# Patient Record
Sex: Male | Born: 1980 | Race: White | Hispanic: No | Marital: Married | Smoking: Never smoker
Health system: Southern US, Community
[De-identification: ages and names within clinical notes are randomized; demographics above are authoritative.]

## PROBLEM LIST (undated history)

## (undated) DIAGNOSIS — J45909 Unspecified asthma, uncomplicated: Secondary | ICD-10-CM

## (undated) DIAGNOSIS — B958 Unspecified staphylococcus as the cause of diseases classified elsewhere: Secondary | ICD-10-CM

## (undated) HISTORY — DX: Unspecified staphylococcus as the cause of diseases classified elsewhere: B95.8

## (undated) HISTORY — DX: Unspecified asthma, uncomplicated: J45.909

## (undated) HISTORY — PX: INCISION AND DRAINAGE ABSCESS: SHX5864

---

## 2010-10-07 ENCOUNTER — Other Ambulatory Visit (HOSPITAL_BASED_OUTPATIENT_CLINIC_OR_DEPARTMENT_OTHER): Payer: Self-pay | Admitting: Family Medicine

## 2010-10-07 DIAGNOSIS — R0600 Dyspnea, unspecified: Secondary | ICD-10-CM

## 2010-10-10 ENCOUNTER — Other Ambulatory Visit (HOSPITAL_BASED_OUTPATIENT_CLINIC_OR_DEPARTMENT_OTHER): Payer: Self-pay | Admitting: Family Medicine

## 2010-10-10 ENCOUNTER — Ambulatory Visit (HOSPITAL_BASED_OUTPATIENT_CLINIC_OR_DEPARTMENT_OTHER)
Admission: RE | Admit: 2010-10-10 | Discharge: 2010-10-10 | Disposition: A | Discharge status: Home / Self Care | Payer: BC Managed Care – PPO | Source: Ambulatory Visit | Attending: Family Medicine | Admitting: Family Medicine

## 2010-10-10 DIAGNOSIS — R05 Cough: Secondary | ICD-10-CM | POA: Insufficient documentation

## 2010-10-10 DIAGNOSIS — R059 Cough, unspecified: Secondary | ICD-10-CM | POA: Insufficient documentation

## 2010-10-10 DIAGNOSIS — R0609 Other forms of dyspnea: Secondary | ICD-10-CM | POA: Insufficient documentation

## 2010-10-10 DIAGNOSIS — R0600 Dyspnea, unspecified: Secondary | ICD-10-CM

## 2010-10-10 DIAGNOSIS — R0989 Other specified symptoms and signs involving the circulatory and respiratory systems: Secondary | ICD-10-CM | POA: Insufficient documentation

## 2010-10-10 DIAGNOSIS — R062 Wheezing: Secondary | ICD-10-CM | POA: Insufficient documentation

## 2010-10-10 MED ORDER — IOHEXOL 300 MG/ML  SOLN
80.0000 mL | Freq: Once | INTRAMUSCULAR | Status: AC | PRN
  Administered 2010-10-10: 80 mL via INTRAVENOUS

## 2011-12-12 IMAGING — CT CT CHEST W/ CM
2 of 3 series · 15 of 36 positions shown, 18 images · IV contrast (APPLIED)
Comparison: None.

CLINICAL DATA: No abnormal chest radiograph, coughing and wheezing

CT CHEST WITH CONTRAST
TECHNIQUE: Multidetector CT imaging of the chest was performed
following the standard protocol during bolus administration of
intravenous contrast.
Contrast: 80 ml Omnipaque 300

[Series 2: chest 5.0 b31f · axial · 0.75mm/px · z∈[-272,+8]mm · 12 of 66 slices shown, 15 images]
[im 5/66  mediastinal]
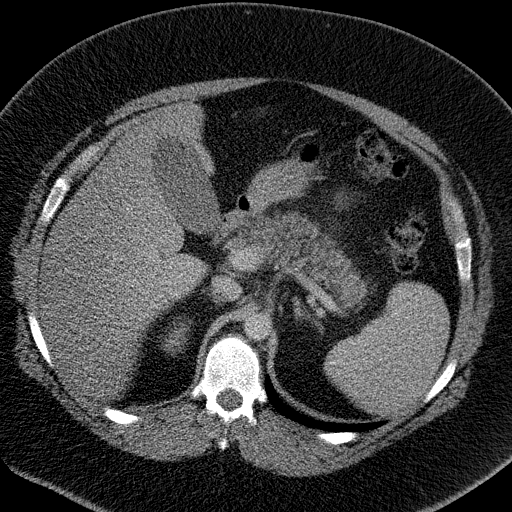
[im 5/66  lung]
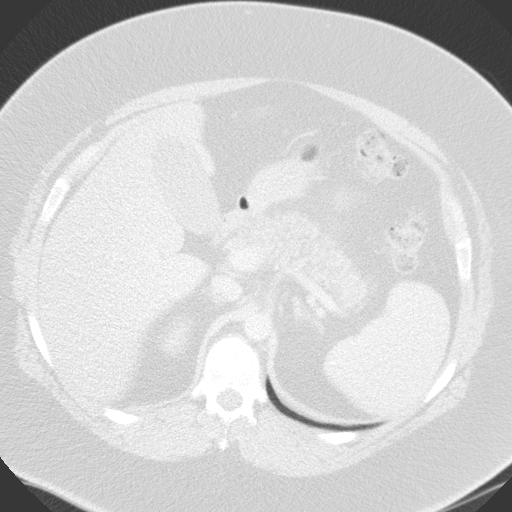
[im 10/66  lung]
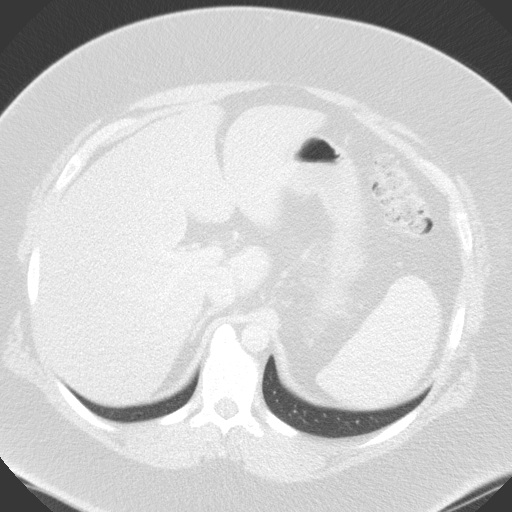
[im 15/66  lung]
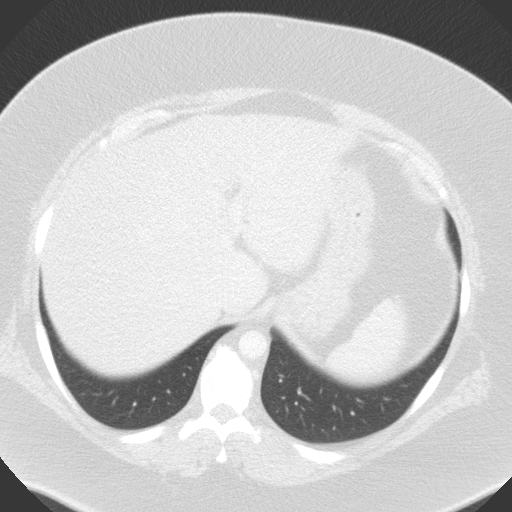
[im 20/66  lung]
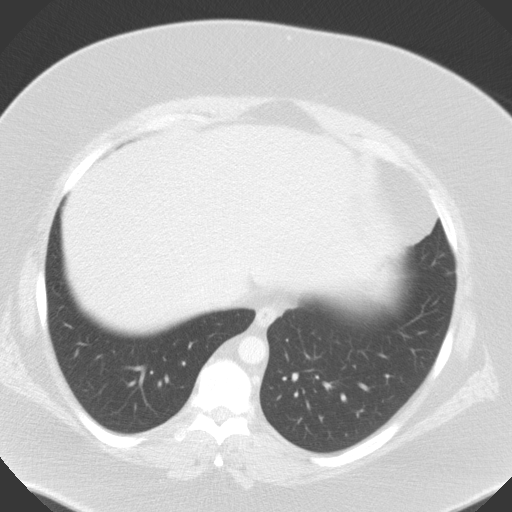
[im 25/66  mediastinal]
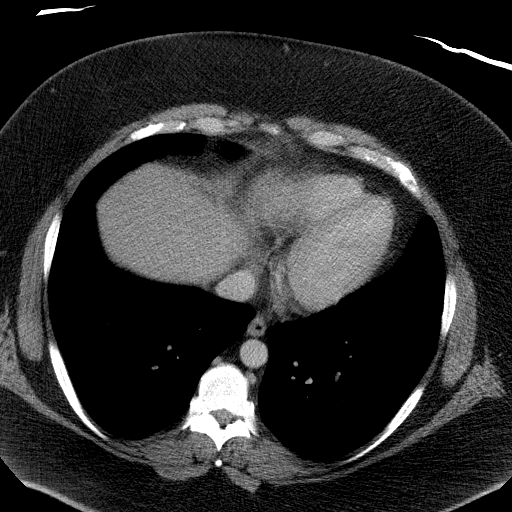
[im 25/66  lung]
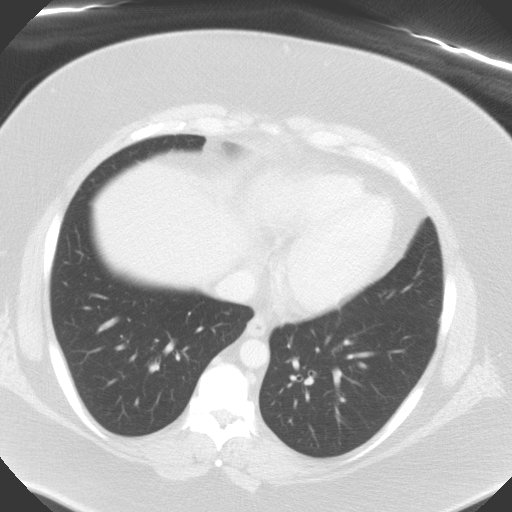
[im 29/66  lung]
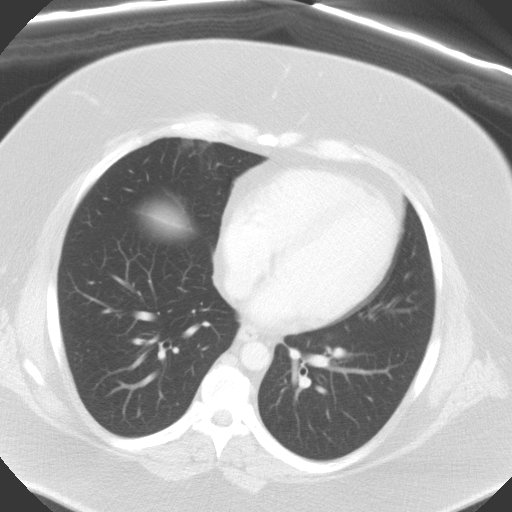
[im 37/66  lung]
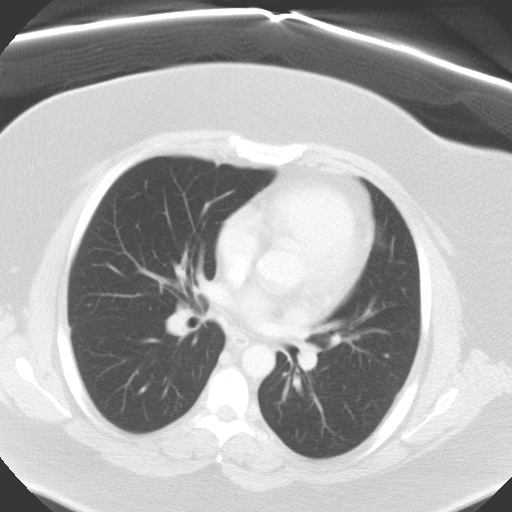
[im 41/66  lung]
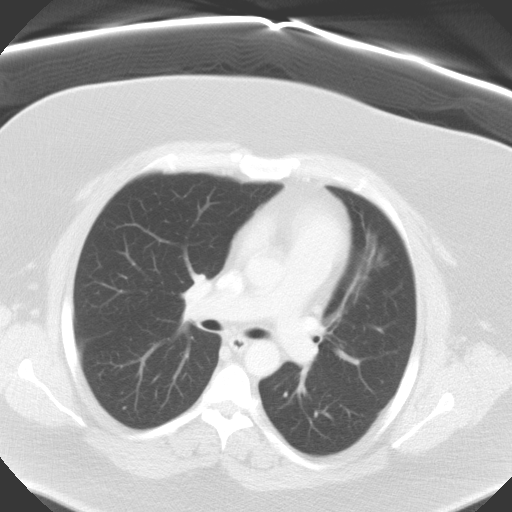
[im 46/66  mediastinal]
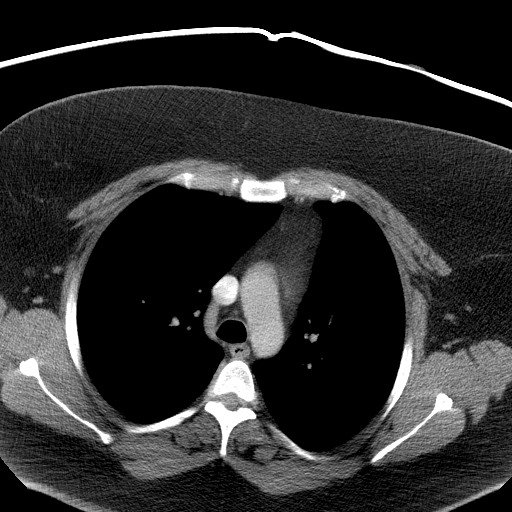
[im 46/66  lung]
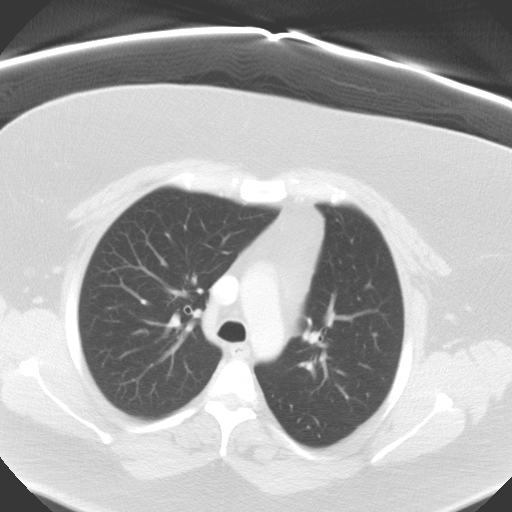
[im 51/66  lung]
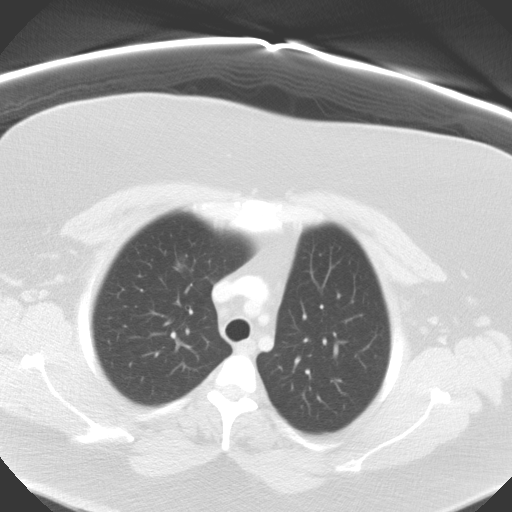
[im 56/66  lung]
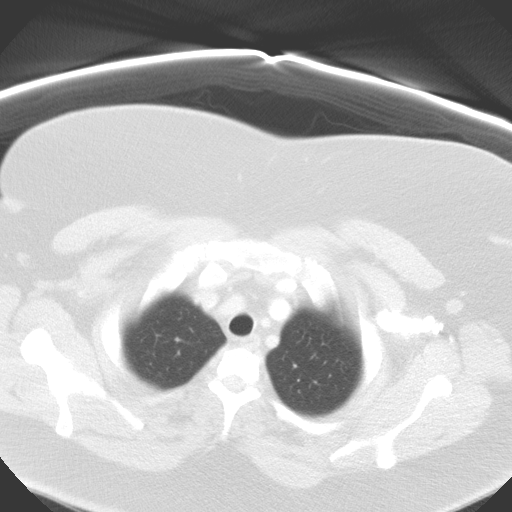
[im 61/66  lung]
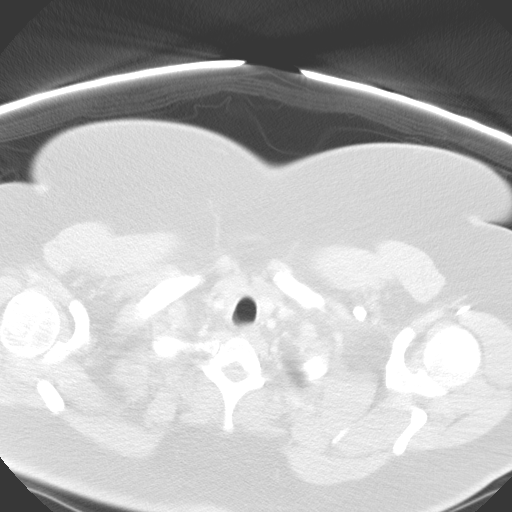

[Series 6: chest 3.0 coronal · coronal · 0.65mm/px · 3 of 113 slices shown]
[im 23/113  lung]
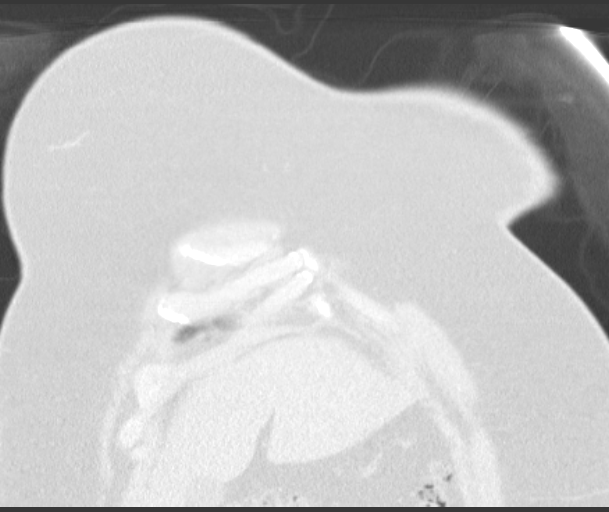
[im 45/113  lung]
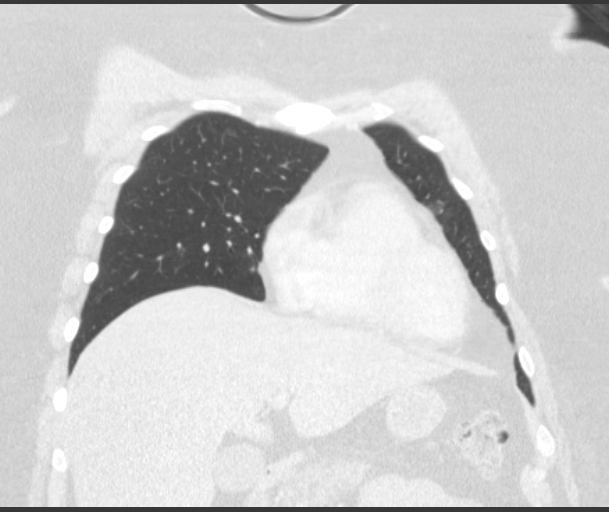
[im 68/113  lung]
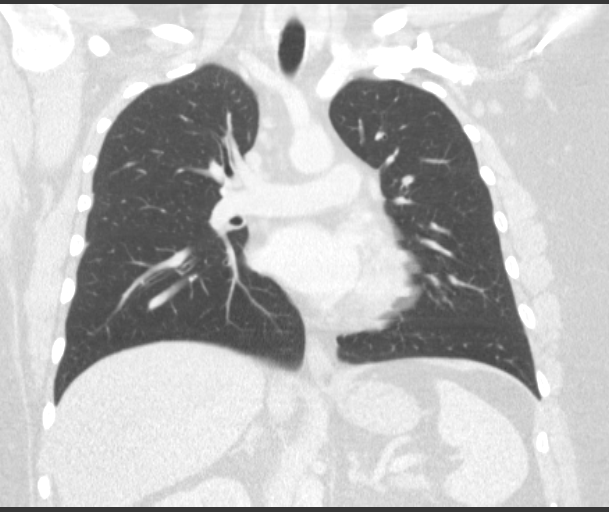

[15 of 36 positions shown; findings below may reference images not displayed]

FINDINGS: No evidence of axillary or supraclavicular
lymphadenopathy.  No mediastinal or hilar lymphadenopathy.  No
pericardial fluid. Esophagus is normal.

Review of the lung windows demonstrates a small focus of ground-
glass opacity within the lingula.  No evidence of pleural fluid,
consolidation, or pneumothorax.  Airways are normal.

Limited view of the upper abdomen is unremarkable.

Limited view of the skeleton demonstrates mild degenerative
spurring of the spine.
IMPRESSION: Small focus of ground-glass opacity in the lingula consistent with
infectious or inflammatory process.  This may represent early or
resolving infection.

## 2013-07-28 ENCOUNTER — Other Ambulatory Visit (HOSPITAL_BASED_OUTPATIENT_CLINIC_OR_DEPARTMENT_OTHER): Payer: Self-pay | Admitting: Family Medicine

## 2013-07-28 DIAGNOSIS — M7989 Other specified soft tissue disorders: Secondary | ICD-10-CM

## 2013-07-29 ENCOUNTER — Ambulatory Visit (HOSPITAL_BASED_OUTPATIENT_CLINIC_OR_DEPARTMENT_OTHER): Payer: BC Managed Care – PPO

## 2013-07-29 ENCOUNTER — Ambulatory Visit (HOSPITAL_BASED_OUTPATIENT_CLINIC_OR_DEPARTMENT_OTHER)
Admission: RE | Admit: 2013-07-29 | Discharge: 2013-07-29 | Disposition: A | Discharge status: Home / Self Care | Payer: BC Managed Care – PPO | Source: Ambulatory Visit | Attending: Family Medicine | Admitting: Family Medicine

## 2013-07-29 DIAGNOSIS — M7989 Other specified soft tissue disorders: Secondary | ICD-10-CM

## 2014-09-30 IMAGING — US US EXTREM LOW VENOUS*L*
1 series · 14 of 20 positions shown · non-contrast
Comparison: None.

CLINICAL DATA: Left leg swelling

EXAM:
LEFT LOWER EXTREMITY VENOUS DOPPLER ULTRASOUND
TECHNIQUE: Gray-scale sonography with graded compression, as well as color
Doppler and duplex ultrasound, were performed to evaluate the deep
venous system from the level of the common femoral vein through the
popliteal and proximal calf veins. Spectral Doppler was utilized to
evaluate flow at rest and with distal augmentation maneuvers.

[Series 1: us extrem low venous*left* · 14 of 20 slices shown]
[im 1/20]
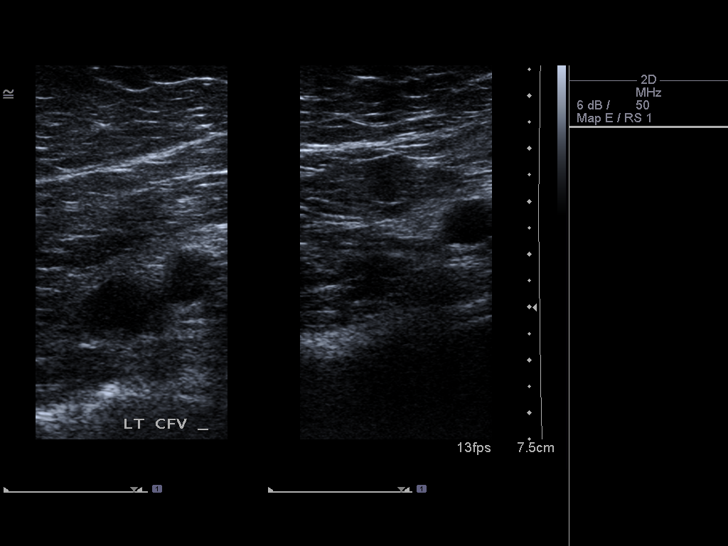
[im 3/20]
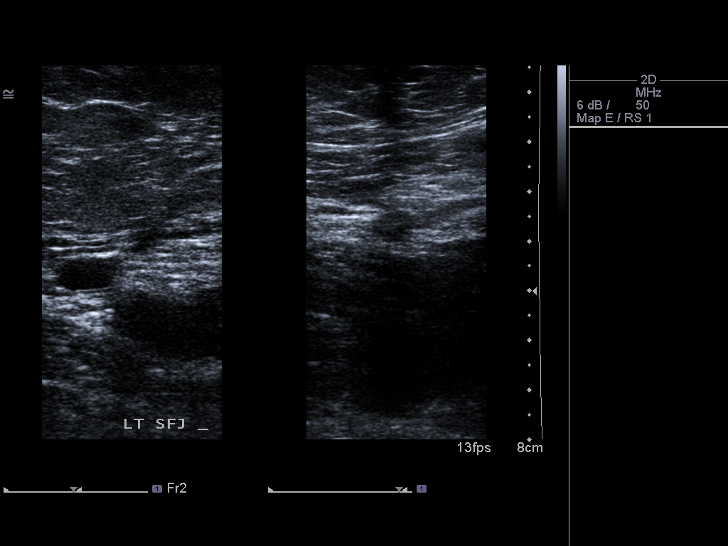
[im 4/20]
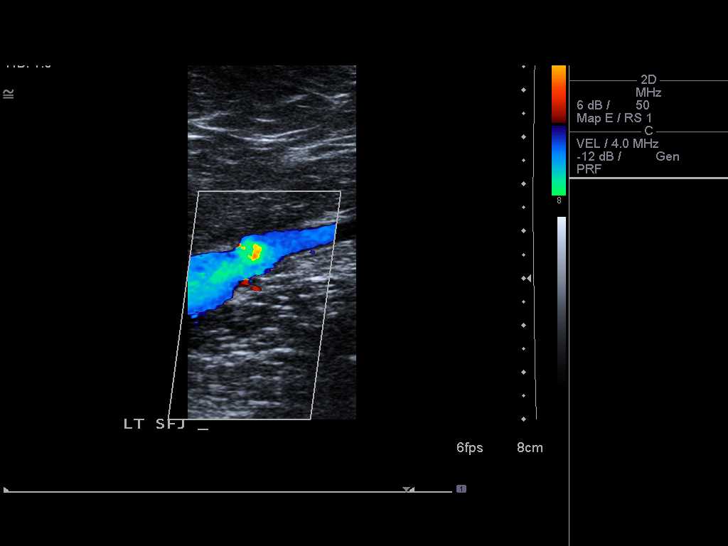
[im 6/20]
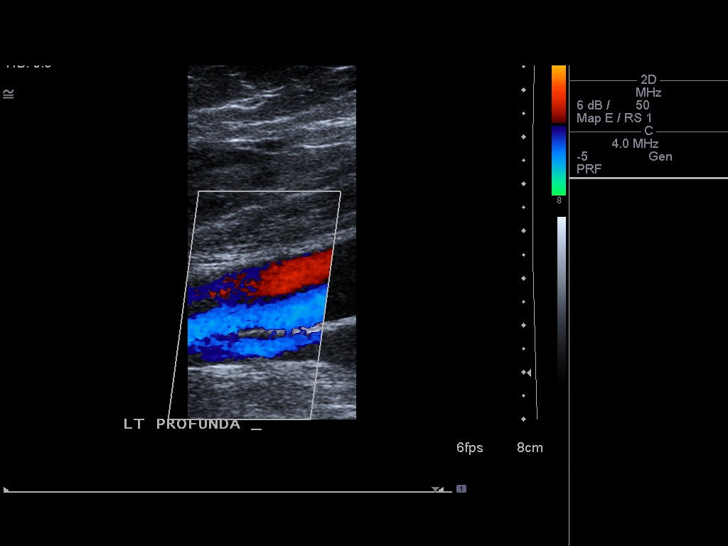
[im 7/20]
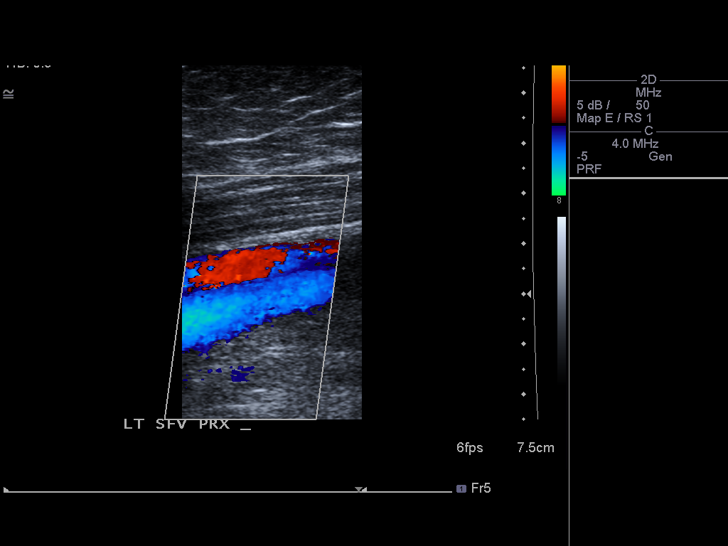
[im 8/20]
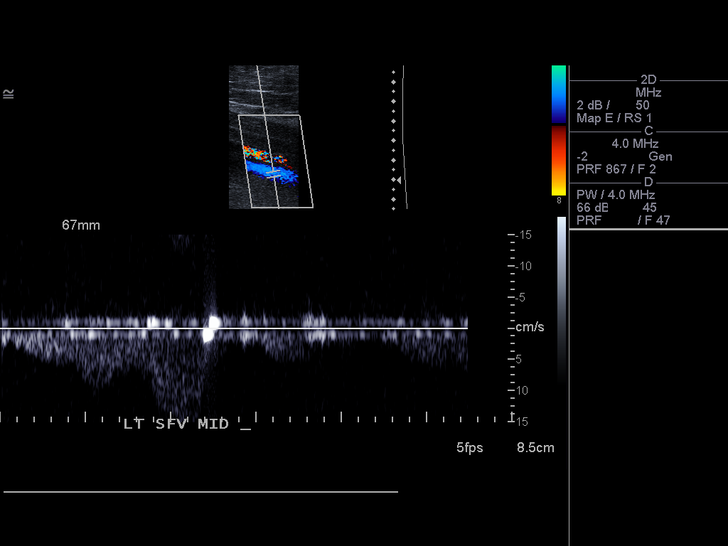
[im 10/20]
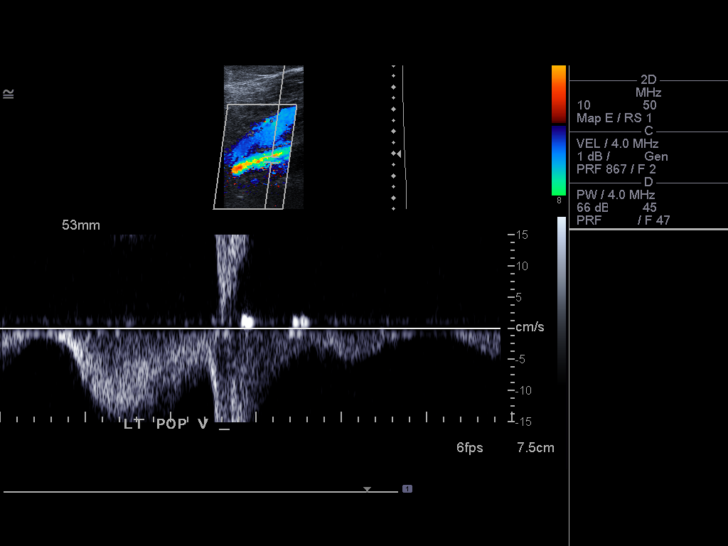
[im 11/20]
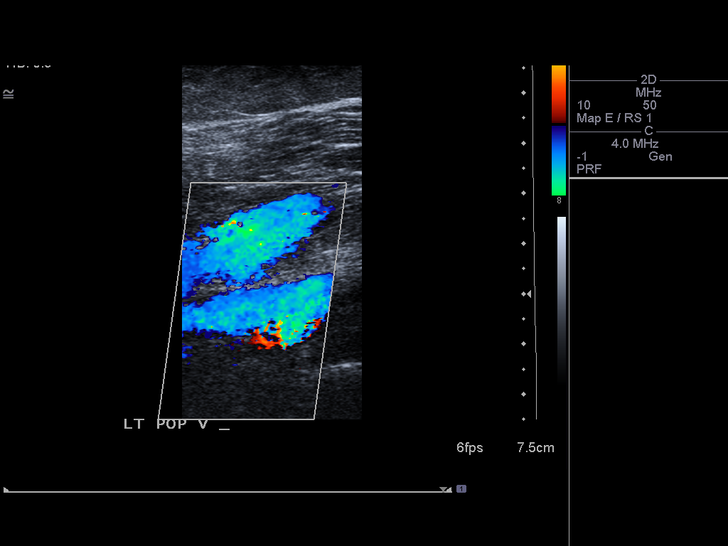
[im 13/20]
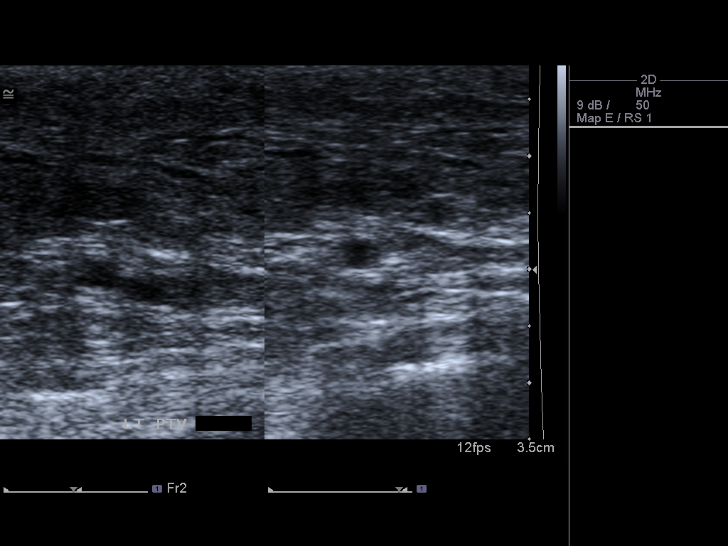
[im 14/20]
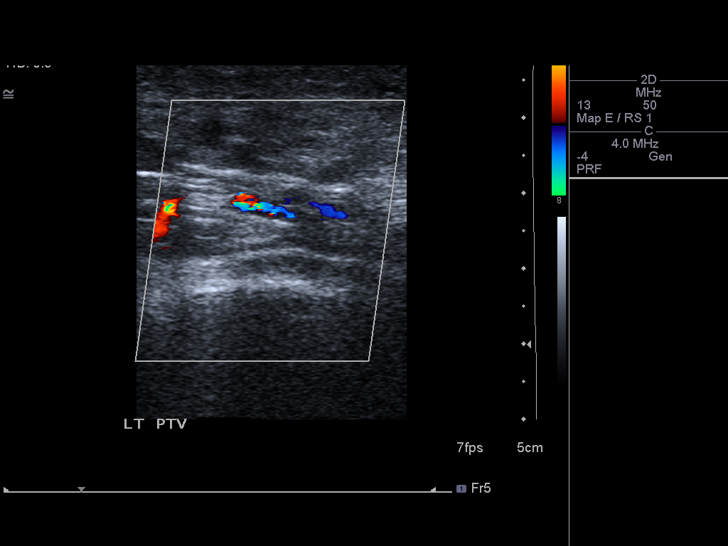
[im 16/20]
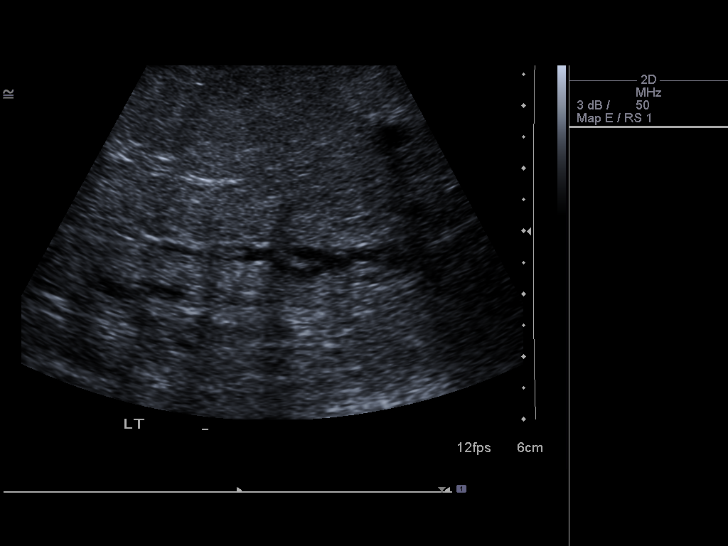
[im 17/20]
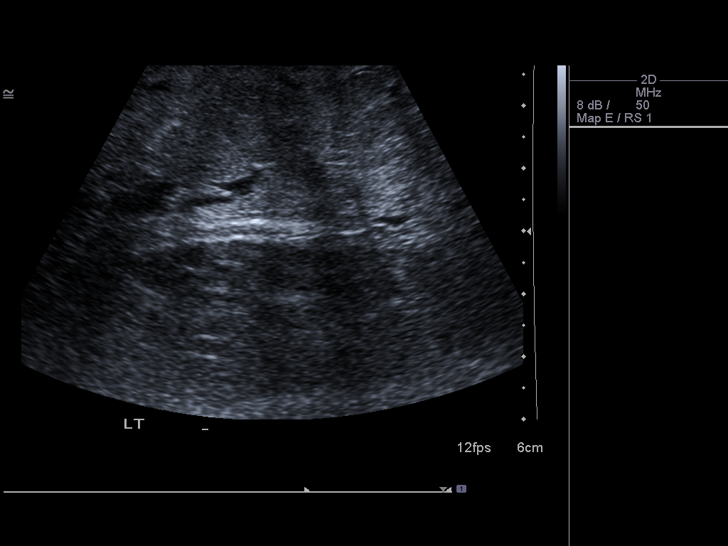
[im 18/20]
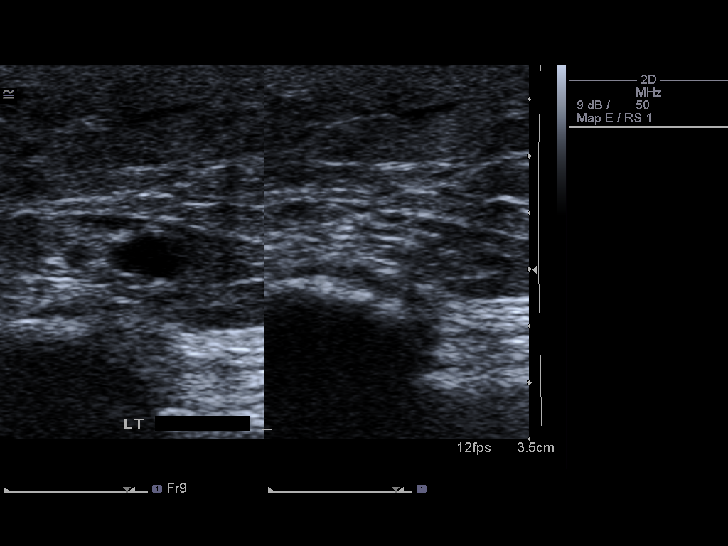
[im 20/20]
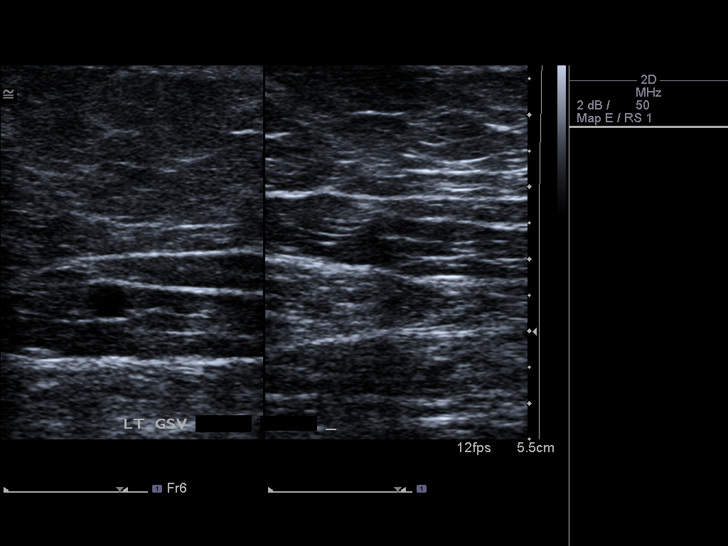

[14 of 20 positions shown; findings below may reference images not displayed]

FINDINGS: Thrombus within deep veins:  None visualized.

Compressibility of deep veins:  Normal.

Duplex waveform respiratory phasicity:  Normal.

Duplex waveform response to augmentation:  Normal.

Venous reflux:  None visualized.

Other findings:  None visualized.
IMPRESSION: No evidence of left lower extremity deep venous thrombosis.

## 2017-09-06 ENCOUNTER — Encounter: Payer: Self-pay | Admitting: Internal Medicine

## 2017-09-06 ENCOUNTER — Other Ambulatory Visit (INDEPENDENT_AMBULATORY_CARE_PROVIDER_SITE_OTHER): Payer: 59

## 2017-09-06 ENCOUNTER — Ambulatory Visit: Payer: 59 | Admitting: Internal Medicine

## 2017-09-06 VITALS — BP 138/92 | HR 89 | Ht 73.0 in | Wt >= 6400 oz

## 2017-09-06 DIAGNOSIS — R058 Other specified cough: Secondary | ICD-10-CM

## 2017-09-06 DIAGNOSIS — M7989 Other specified soft tissue disorders: Secondary | ICD-10-CM | POA: Diagnosis not present

## 2017-09-06 DIAGNOSIS — R05 Cough: Secondary | ICD-10-CM | POA: Diagnosis not present

## 2017-09-06 LAB — CBC WITH DIFFERENTIAL/PLATELET
BASOS PCT: 0.7 % (ref 0.0–3.0)
Basophils Absolute: 0.1 10*3/uL (ref 0.0–0.1)
EOS ABS: 0.4 10*3/uL (ref 0.0–0.7)
EOS PCT: 2.9 % (ref 0.0–5.0)
HEMATOCRIT: 40.1 % (ref 39.0–52.0)
Hemoglobin: 13 g/dL (ref 13.0–17.0)
LYMPHS PCT: 16.4 % (ref 12.0–46.0)
Lymphs Abs: 2.1 10*3/uL (ref 0.7–4.0)
MCHC: 32.5 g/dL (ref 30.0–36.0)
MCV: 83.7 fl (ref 78.0–100.0)
Monocytes Absolute: 0.9 10*3/uL (ref 0.1–1.0)
Monocytes Relative: 6.9 % (ref 3.0–12.0)
NEUTROS ABS: 9.5 10*3/uL — AB (ref 1.4–7.7)
Neutrophils Relative %: 73.1 % (ref 43.0–77.0)
Platelets: 394 10*3/uL (ref 150.0–400.0)
RBC: 4.79 Mil/uL (ref 4.22–5.81)
RDW: 15.4 % (ref 11.5–15.5)
WBC: 12.9 10*3/uL — ABNORMAL HIGH (ref 4.0–10.5)

## 2017-09-06 LAB — NITRIC OXIDE: Nitric Oxide: 14

## 2017-09-06 MED ORDER — PANTOPRAZOLE SODIUM 40 MG PO TBEC: DELAYED_RELEASE_TABLET | ORAL | 2 refills | Status: DC

## 2017-09-06 NOTE — Progress Notes (Signed)
Subjective:     Patient ID: Joe Harper, male   DOB: January 16, 1981,    MRN: [id number]  HPI  26 yowm never smoker from [place] as far back as he can remember had seasonal rhinitis spring > fall then moved to [facility] 2006 s change of pattern relatively mild symptoms s asthma /need for inhalers and lasted only for a week or two twice year rx otcs then pattern changed in winter of 2017 with new cough/ noct wheeze s sob  rx advair prn 100% eliminated  Then similar problem winter 2018 rx saba helped a little and eventually resolved but lingered longer that time  then tgiving 2018  similar onset/ similar symptoms  > restarted advair dpi no better at all stopped after one month and persisted with noct wheeze s sob  ever since then leg swelling started xmas/ny new leg swelling R > L with nl bnp so referred to pulmonary clinic 09/06/2017 by Daine Floras PA    09/06/2017 1st [facility] office visit/ [place] Subia   Chief Complaint  Patient presents with  . pulmonary Consult    referred by [facility] for cough , wheeze Since Thanksgiving , swollen ankles since Christmas.  Cannot lay down at night without Wheezing, clear white sputum. SOB when working out with his Systems analyst, started in Nov / Dec.   main issue is noisy breathing each noct lying flat/ wakes up due to noisy breathing and takes a drink of water, resolves, and goes back to sleeping and yet good progress in terms of daytime breathing  with Personal trainer including gxt x 15 min x 2-3 mpn and 0 incline and improving with each session and already had BNP check = reported nl  Wt is lower than it was in Nov 2018  Sometimes tries to clear throat to clear wheezing which doesn't typically wake him up unless gets real noisy On ppi qam only/ no daytime drowsiness or am HA / no flare of seasonal rhinitis or need for otcs  No obvious day to day or daytime variability or assoc excess/ purulent sputum or mucus plugs or hemoptysis or cp or chest  tightness, subjective wheeze or overt sinus or hb symptoms. No unusual exposure hx or h/o childhood pna/ asthma or knowledge of premature birth.    Also denies any obvious fluctuation of symptoms with weather or environmental changes or other aggravating or alleviating factors except as outlined above   Current Allergies, Complete Past Medical History, Past Surgical History, Family History, and Social History were reviewed in [employer] record.  ROS  The following are not active complaints unless bolded Hoarseness, sore throat, dysphagia, dental problems, itching, sneezing,  nasal congestion or discharge of excess mucus or purulent secretions, ear ache,   fever, chills, sweats, unintended wt loss or wt gain, classically pleuritic or exertional cp,  orthopnea pnd or leg swelling, presyncope, palpitations, abdominal pain, anorexia, nausea, vomiting, diarrhea  or change in bowel habits or change in bladder habits, change in stools or change in urine, dysuria, hematuria,  rash, arthralgias, visual complaints, headache, numbness, weakness or ataxia or problems with walking or coordination,  change in mood/affect or memory.        Current Meds  Medication Sig  . [DISCONTINUED] OMEPRAZOLE PO Take 40 mg by mouth daily.           Review of Systems     Objective:   Physical Exam amb obese wm   Wt Readings from Last 3  Encounters:  09/06/17 (!) 431 lb (195.5 kg)     Vital signs reviewed - Note on arrival 02 sats  97% on RA    HEENT: nl dentition, turbinates bilaterally, and oropharynx. Nl external ear canals without cough reflex Modified Mallampati Score =   1  NECK :  without JVD/Nodes/TM/ nl carotid upstrokes bilaterally   LUNGS: no acc muscle use,  Nl contour chest which is clear to A and P bilaterally without cough on insp or exp maneuvers- minimal pseudowheeze lying flat assoc with urge to "cough it out" by clearing throat   CV:  RRR  no s3 or murmur or  increase in P2, and slt asym swelling both ankles  s pitting R > L   ABD:  soft and nontender with nl inspiratory excursion in the supine position. No bruits or organomegaly appreciated, bowel sounds nl  MS:  Nl gait/ ext warm without deformities, calf tenderness, cyanosis or clubbing No obvious joint restrictions   SKIN: warm and dry without lesions    NEURO:  alert, approp, nl sensorium with  no motor or cerebellar deficits apparent.    [facility] cxr reported ok , not avail on day of ov - office records do not include image reports    Assessment:

## 2017-09-06 NOTE — Patient Instructions (Addendum)
Pantoprazole (protonix) 40 mg (ok to use up omeprazole)    Take  30-60 min before first meal of the day and Pepcid (famotidine)  20 mg one @  bedtime until return to office - this is the best way to tell whether stomach acid is contributing to your problem.    GERD (REFLUX)  is an extremely common cause of respiratory symptoms just like yours , many times with no obvious heartburn at all.    It can be treated with medication, but also with lifestyle changes including elevation of the head of your bed (ideally with 6 inch  bed blocks),  Smoking cessation, avoidance of late meals, excessive alcohol, and avoid fatty foods, chocolate, peppermint, colas, red wine, and acidic juices such as orange juice.  NO MINT OR MENTHOL PRODUCTS SO NO COUGH DROPS   USE SUGARLESS CANDY INSTEAD (Jolley ranchers or Stover's or Life Savers) or even ice chips will also do - the key is to swallow to prevent all throat clearing. NO OIL BASED VITAMINS - use powdered substitutes.    Please remember to go to the lab department downstairs in the basement  for your tests - we will call you with the results when they are available.   Please schedule a follow up office visit in 4 weeks, sooner if needed       .

## 2017-09-07 ENCOUNTER — Encounter: Payer: Self-pay | Admitting: Internal Medicine

## 2017-09-07 DIAGNOSIS — M7989 Other specified soft tissue disorders: Secondary | ICD-10-CM | POA: Insufficient documentation

## 2017-09-07 NOTE — Assessment & Plan Note (Signed)
Body mass index is 56.86 kg/m.   No results found for: TSH   Contributing to gerd/ DVT risk/ doe/reviewed the need and the process to achieve and maintain neg calorie balance > defer f/u primary care including intermittently monitoring thyroid status

## 2017-09-07 NOTE — Assessment & Plan Note (Signed)
No assoc pitting, unimpressive exam and bnp reported nl but he is at risk of dvt > check d dimer

## 2017-09-07 NOTE — Assessment & Plan Note (Addendum)
FENO 09/06/2017  =   14 - Spirometry 09/06/2017  No obstruction though effort dep portion of f/v a bit atypical  - Allergy profile 09/06/2017 >  Eos 0. /  IgE   - 09/06/2017 max rx for gerd x 4 trial   Although the audible wheeze is the most prominent part of his hx, it goes away with swallowing even water so most likely =  Upper airway cough syndrome (previously labeled PNDS),  is so named because it's frequently impossible to sort out how much is  CR/sinusitis with freq throat clearing (which can be related to primary GERD)   vs  causing  secondary (" extra esophageal")  GERD from wide swings in gastric pressure that occur with throat clearing, often  promoting self use of mint and menthol lozenges that reduce the lower esophageal sphincter tone and exacerbate the problem further in a cyclical fashion.   These are the same pts (now being labeled as having "irritable larynx syndrome" by some cough centers) who not infrequently have a history of having failed to tolerate ace inhibitors,  dry powder inhalers or biphosphonates or report having atypical/extraesophageal reflux symptoms that don't respond to standard doses of PPI  and are easily confused as having aecopd or asthma flares by even experienced allergists/ pulmonologists (myself included).    Since presently taking just an am dose of ppi and his symptoms are noct (>> 12 h p dose) rec first try bid ac dosing/ diet adjustments and if this corrects the problem then change to q pm ac only  - if not can do Methacholine challenge testing next     Total time devoted to counseling  > 50 % of initial 60 min office visit:  review case with pt/ discussion of options/alternatives/ personally creating written customized instructions  in presence of pt  then going over those specific  Instructions directly with the pt including how to use all of the meds but in particular covering each new medication in detail and the difference between the maintenance=  "automatic" meds and the prns using an action plan format for the latter (If this problem/symptom => do that organization reading Left to right).  Please see AVS from this visit for a full list of these instructions which I personally wrote for this pt and  are unique to this visit.

## 2017-09-10 LAB — RESPIRATORY ALLERGY PROFILE REGION II ~~LOC~~
Allergen, Cedar tree, t12: <0.1 kU/L
Allergen, Mouse Urine Protein, e78: <0.1 kU/L
Allergen, Mulberry, t76: <0.1 kU/L
Allergen, Oak,t7: <0.1 kU/L
Allergen, P. notatum, m1: <0.1 kU/L
Aspergillus fumigatus, m3: <0.1 kU/L
CLASS: 0
CLASS: 0
CLASS: 0
CLASS: 0
CLASS: 0
CLASS: 0
CLASS: 0
CLASS: 0
CLASS: 0
Cat Dander: <0.1 kU/L
Class: 0
Class: 0
Class: 0
Class: 0
Class: 0
Class: 0
Class: 0
Class: 0
Class: 0
Class: 0
Class: 0
Class: 0
Class: 0
Class: 0
Class: 0
Cockroach: <0.1 kU/L
Dog Dander: <0.1 kU/L
IgE (Immunoglobulin E), Serum: <2 kU/L (ref <=114)
Rough Pigweed  IgE: <0.1 kU/L
Timothy Grass: <0.1 kU/L

## 2017-09-10 LAB — D-DIMER, QUANTITATIVE (NOT AT ARMC): D DIMER QUANT: 0.26 ug{FEU}/mL (ref <0.50)

## 2017-09-10 LAB — INTERPRETATION:

## 2017-09-11 NOTE — Progress Notes (Signed)
Spoke with pt and notified of results per Dr. Wert. Pt verbalized understanding and denied any questions. 

## 2017-10-05 ENCOUNTER — Ambulatory Visit: Payer: 59 | Admitting: Internal Medicine

## 2017-11-02 ENCOUNTER — Ambulatory Visit: Payer: 59 | Admitting: Internal Medicine

## 2018-10-19 ENCOUNTER — Other Ambulatory Visit: Payer: Self-pay | Admitting: Internal Medicine

## 2018-10-19 DIAGNOSIS — R05 Cough: Secondary | ICD-10-CM

## 2018-10-19 DIAGNOSIS — R058 Other specified cough: Secondary | ICD-10-CM
